# Patient Record
Sex: Female | Born: 1965 | Race: Black or African American | Hispanic: No | Smoking: Never smoker
Health system: Southern US, Community
[De-identification: ages and names within clinical notes are randomized; demographics above are authoritative.]

## PROBLEM LIST (undated history)

## (undated) DIAGNOSIS — D219 Benign neoplasm of connective and other soft tissue, unspecified: Secondary | ICD-10-CM

## (undated) DIAGNOSIS — N920 Excessive and frequent menstruation with regular cycle: Principal | ICD-10-CM

## (undated) DIAGNOSIS — N946 Dysmenorrhea, unspecified: Secondary | ICD-10-CM

## (undated) HISTORY — PX: TUBAL LIGATION: SHX77

## (undated) HISTORY — DX: Excessive and frequent menstruation with regular cycle: N92.0

## (undated) HISTORY — PX: BREAST BIOPSY: SHX20

## (undated) HISTORY — DX: Dysmenorrhea, unspecified: N94.6

## (undated) HISTORY — DX: Benign neoplasm of connective and other soft tissue, unspecified: D21.9

---

## 2007-06-10 ENCOUNTER — Other Ambulatory Visit: Admission: RE | Admit: 2007-06-10 | Discharge: 2007-06-10 | Payer: Self-pay | Admitting: Obstetrics and Gynecology

## 2008-06-11 ENCOUNTER — Other Ambulatory Visit: Admission: RE | Admit: 2008-06-11 | Discharge: 2008-06-11 | Payer: Self-pay | Admitting: Obstetrics and Gynecology

## 2009-07-08 ENCOUNTER — Other Ambulatory Visit: Admission: RE | Admit: 2009-07-08 | Discharge: 2009-07-08 | Payer: Self-pay | Admitting: Obstetrics and Gynecology

## 2010-07-16 ENCOUNTER — Other Ambulatory Visit
Admission: RE | Admit: 2010-07-16 | Discharge: 2010-07-16 | Payer: Self-pay | Source: Home / Self Care | Admitting: Obstetrics and Gynecology

## 2011-07-21 ENCOUNTER — Other Ambulatory Visit (HOSPITAL_COMMUNITY)
Admission: RE | Admit: 2011-07-21 | Discharge: 2011-07-21 | Disposition: A | Discharge status: Home / Self Care | Payer: BC Managed Care – PPO | Source: Ambulatory Visit | Attending: Obstetrics and Gynecology | Admitting: Obstetrics and Gynecology

## 2011-07-21 DIAGNOSIS — Z01419 Encounter for gynecological examination (general) (routine) without abnormal findings: Secondary | ICD-10-CM | POA: Insufficient documentation

## 2011-08-06 ENCOUNTER — Other Ambulatory Visit (HOSPITAL_COMMUNITY)
Admission: RE | Admit: 2011-08-06 | Discharge: 2011-08-06 | Disposition: A | Discharge status: Home / Self Care | Payer: BC Managed Care – PPO | Source: Ambulatory Visit | Attending: Obstetrics and Gynecology | Admitting: Obstetrics and Gynecology

## 2011-08-06 DIAGNOSIS — Z01419 Encounter for gynecological examination (general) (routine) without abnormal findings: Secondary | ICD-10-CM | POA: Insufficient documentation

## 2012-08-11 ENCOUNTER — Other Ambulatory Visit (HOSPITAL_COMMUNITY)
Admission: RE | Admit: 2012-08-11 | Discharge: 2012-08-11 | Disposition: A | Discharge status: Home / Self Care | Payer: BC Managed Care – PPO | Source: Ambulatory Visit | Attending: Obstetrics and Gynecology | Admitting: Obstetrics and Gynecology

## 2012-08-11 ENCOUNTER — Other Ambulatory Visit: Payer: Self-pay | Admitting: Adult Health

## 2012-08-11 DIAGNOSIS — Z1151 Encounter for screening for human papillomavirus (HPV): Secondary | ICD-10-CM | POA: Insufficient documentation

## 2012-08-11 DIAGNOSIS — Z01419 Encounter for gynecological examination (general) (routine) without abnormal findings: Secondary | ICD-10-CM | POA: Insufficient documentation

## 2013-06-25 ENCOUNTER — Emergency Department (HOSPITAL_BASED_OUTPATIENT_CLINIC_OR_DEPARTMENT_OTHER)
Admission: EM | Admit: 2013-06-25 | Discharge: 2013-06-25 | Disposition: A | Discharge status: Home / Self Care | Payer: BC Managed Care – PPO | Attending: Emergency Medicine | Admitting: Emergency Medicine

## 2013-06-25 ENCOUNTER — Emergency Department (HOSPITAL_BASED_OUTPATIENT_CLINIC_OR_DEPARTMENT_OTHER): Payer: BC Managed Care – PPO

## 2013-06-25 ENCOUNTER — Encounter (HOSPITAL_BASED_OUTPATIENT_CLINIC_OR_DEPARTMENT_OTHER): Payer: Self-pay | Admitting: Emergency Medicine

## 2013-06-25 DIAGNOSIS — J9801 Acute bronchospasm: Secondary | ICD-10-CM | POA: Insufficient documentation

## 2013-06-25 MED ORDER — IPRATROPIUM BROMIDE 0.02 % IN SOLN
0.5000 mg | Freq: Once | RESPIRATORY_TRACT | Status: AC
  Administered 2013-06-25: 0.5 mg via RESPIRATORY_TRACT

## 2013-06-25 MED ORDER — ALBUTEROL SULFATE HFA 108 (90 BASE) MCG/ACT IN AERS
2.0000 | INHALATION_SPRAY | RESPIRATORY_TRACT | Status: DC | PRN
  Administered 2013-06-25: 2 via RESPIRATORY_TRACT
  Filled 2013-06-25: qty 6.7

## 2013-06-25 MED ORDER — ALBUTEROL SULFATE (5 MG/ML) 0.5% IN NEBU
5.0000 mg | INHALATION_SOLUTION | Freq: Once | RESPIRATORY_TRACT | Status: AC
  Administered 2013-06-25: 5 mg via RESPIRATORY_TRACT

## 2013-06-25 MED ORDER — METHYLPREDNISOLONE SODIUM SUCC 125 MG IJ SOLR
125.0000 mg | Freq: Once | INTRAMUSCULAR | Status: AC
  Administered 2013-06-25: 125 mg via INTRAMUSCULAR
  Filled 2013-06-25: qty 2

## 2013-06-25 MED ORDER — METHYLPREDNISOLONE 4 MG PO KIT: PACK | ORAL | Status: DC

## 2013-06-25 MED ORDER — ALBUTEROL SULFATE (5 MG/ML) 0.5% IN NEBU
5.0000 mg | INHALATION_SOLUTION | Freq: Once | RESPIRATORY_TRACT | Status: AC
  Administered 2013-06-25: 5 mg via RESPIRATORY_TRACT
  Filled 2013-06-25: qty 1

## 2013-06-25 NOTE — ED Notes (Signed)
Patient transported to X-ray ambulatory with tech. 

## 2013-06-25 NOTE — ED Provider Notes (Signed)
CSN: [id number]     Arrival date & time 06/25/13  0214 History   First MD Initiated Contact with Patient 06/25/13 0225     Chief Complaint  Patient presents with  . Shortness of Breath   (Consider location/radiation/quality/duration/timing/severity/associated sxs/prior Treatment) HPI This is a 47 year old female with no significant past medical history. Three weeks ago she developed a cough and she was treated with an antibiotic and 6 days of prednisone. Her symptoms resolved until 2 days ago her cough returned. The cough is nonproductive. Yesterday she developed shortness of breath and wheezing. She tried her son's albuterol nebulizer without relief. She is also been taking Mucinex without relief. She denies fever, chills, nausea, vomiting or diarrhea. She denies chest pain. She has had some nasal congestion. Her symptoms are moderate and worse with exertion.  History reviewed. No pertinent past medical history. History reviewed. No pertinent past surgical history. History reviewed. No pertinent family history. History  Substance Use Topics  . Smoking status: Never Smoker   . Smokeless tobacco: Not on file  . Alcohol Use: Yes   OB History   Grav Para Term Preterm Abortions TAB SAB Ect Mult Living                 Review of Systems  All other systems reviewed and are negative.    Allergies  Review of patient's allergies indicates not on file.  Home Medications  No current outpatient prescriptions on file. BP 118/70  Pulse 100  Temp(Src) 98.6 F (37 C) (Oral)  Resp 20  Ht 5' 1.5" (1.562 m)  Wt 157 lb (71.215 kg)  BMI 29.19 kg/m2  SpO2 96%  Physical Exam General: Well-developed, well-nourished female in no acute distress; appearance consistent with age of record HENT: normocephalic; atraumatic Eyes: pupils equal, round and reactive to light; extraocular muscles intact; arcus senilis bilaterally Neck: supple Heart: regular rate and rhythm Lungs: Decreased air movement  bilaterally with expiratory wheezes, notably in the bases Abdomen: soft; nondistended; nontender; no masses or hepatosplenomegaly; bowel sounds present Extremities: No deformity; full range of motion Neurologic: Awake, alert and oriented; motor function intact in all extremities and symmetric; no facial droop Skin: Warm and dry Psychiatric: Normal mood and affect    ED Course  Procedures (including critical care time)   MDM  Nursing notes and vitals signs, including pulse oximetry, reviewed.  Summary of this visit's results, reviewed by myself:  Labs:  No results found for this or any previous visit (from the past 24 hour(s)).  Imaging Studies: Dg Chest 2 View  06/25/2013   CLINICAL DATA:  Shortness of breath.  EXAM: CHEST  2 VIEW  COMPARISON:  None available for comparison at time of study interpretation.  FINDINGS: Cardiomediastinal silhouette is unremarkable. The lungs are clear without pleural effusions or focal consolidations. Pulmonary vasculature is unremarkable. Trachea projects midline and there is no pneumothorax. Soft tissue planes and included osseous structures are nonsuspicious.  IMPRESSION: No acute cardiopulmonary process ; normal chest radiograph.   Electronically Signed   By: Awilda Metro   On: 06/25/2013 02:42   3:41 AM Lungs clear after second neb treatment. Will start on albuterol inhaler, steroid taper; she has a PCP with whom she will follow up.   Hanley Seamen, MD 06/25/13 [phone]

## 2013-06-25 NOTE — ED Notes (Signed)
Pt reports onset of SOB since Friday denies Hx COPD or ASTHMA  OTC mucinex  With minimal relief

## 2013-08-14 ENCOUNTER — Encounter: Payer: Self-pay | Admitting: Adult Health

## 2013-08-14 ENCOUNTER — Encounter (INDEPENDENT_AMBULATORY_CARE_PROVIDER_SITE_OTHER): Payer: Self-pay

## 2013-08-14 ENCOUNTER — Ambulatory Visit (INDEPENDENT_AMBULATORY_CARE_PROVIDER_SITE_OTHER): Payer: BC Managed Care – PPO | Admitting: Adult Health

## 2013-08-14 VITALS — BP 110/70 | HR 74 | Ht 60.5 in | Wt 159.0 lb

## 2013-08-14 DIAGNOSIS — Z1212 Encounter for screening for malignant neoplasm of rectum: Secondary | ICD-10-CM

## 2013-08-14 DIAGNOSIS — Z01419 Encounter for gynecological examination (general) (routine) without abnormal findings: Secondary | ICD-10-CM

## 2013-08-14 LAB — HEMOCCULT GUIAC POC 1CARD (OFFICE): FECAL OCCULT BLD: NEGATIVE

## 2013-08-14 NOTE — Patient Instructions (Signed)
Physical in 1 year Mammogram yearly  Colonoscopy at 49 Labs at work 

## 2013-08-14 NOTE — Progress Notes (Signed)
Patient ID: Amanda Poole, female   DOB: 1965-12-13, 48 y.o.   MRN: [id number] History of Present Illness: Kayley is a 48 year old black female in for physical , she had a normal pap with negative HPV 08/11/12. Got flu shot at work and had labs at work, that were normal.  Current Medications, Allergies, Past Medical History, Past Surgical History, Family History and Social History were reviewed in [employer] record.     Review of Systems: Patient denies any headaches, blurred vision, shortness of breath, chest pain, abdominal pain, problems with bowel movements, urination, or intercourse. No mood changes, does have pain in knees at times and left one swells occasionally but wears heels daily.    Physical Exam:BP 110/70  Pulse 74  Ht 5' 0.5" (1.537 m)  Wt 159 lb (72.122 kg)  BMI 30.53 kg/m2  LMP 07/16/2013 General:  Well developed, well nourished, no acute distress Skin:  Warm and dry Neck:  Midline trachea, normal thyroid Lungs; Clear to auscultation bilaterally Breast:  No dominant palpable mass, retraction, or nipple discharge Cardiovascular: Regular rate and rhythm Abdomen:  Soft, non tender, no hepatosplenomegaly Pelvic:  External genitalia is normal in appearance.  The vagina is normal in appearance.  The cervix is bulbous.  Uterus is felt to be normal size, shape, and contour.  No  adnexal masses or tenderness noted. Rectal: Good sphincter tone, no polyps, or hemorrhoids felt.  Hemoccult negative. Extremities:  No swelling or varicosities noted Psych:  No mood changes, alert and cooperative,seems happy Discussed menopause usually occurs at about 51.5 years with period cessation, some have hot flashes and others do not.  Impression: Yearly gyn exam no pap    Plan: Physical in 1 year, pap 2017 Mammogram yearly Colonoscopy at 59  Labs at work

## 2013-09-15 ENCOUNTER — Encounter: Payer: Self-pay | Admitting: Adult Health

## 2014-03-21 IMAGING — CR DG CHEST 2V
2 series · 2 of 2 positions shown · non-contrast
Comparison: None available for comparison at time of study
interpretation.

CLINICAL DATA: Shortness of breath.

EXAM:
CHEST  2 VIEW

[w chest pa]
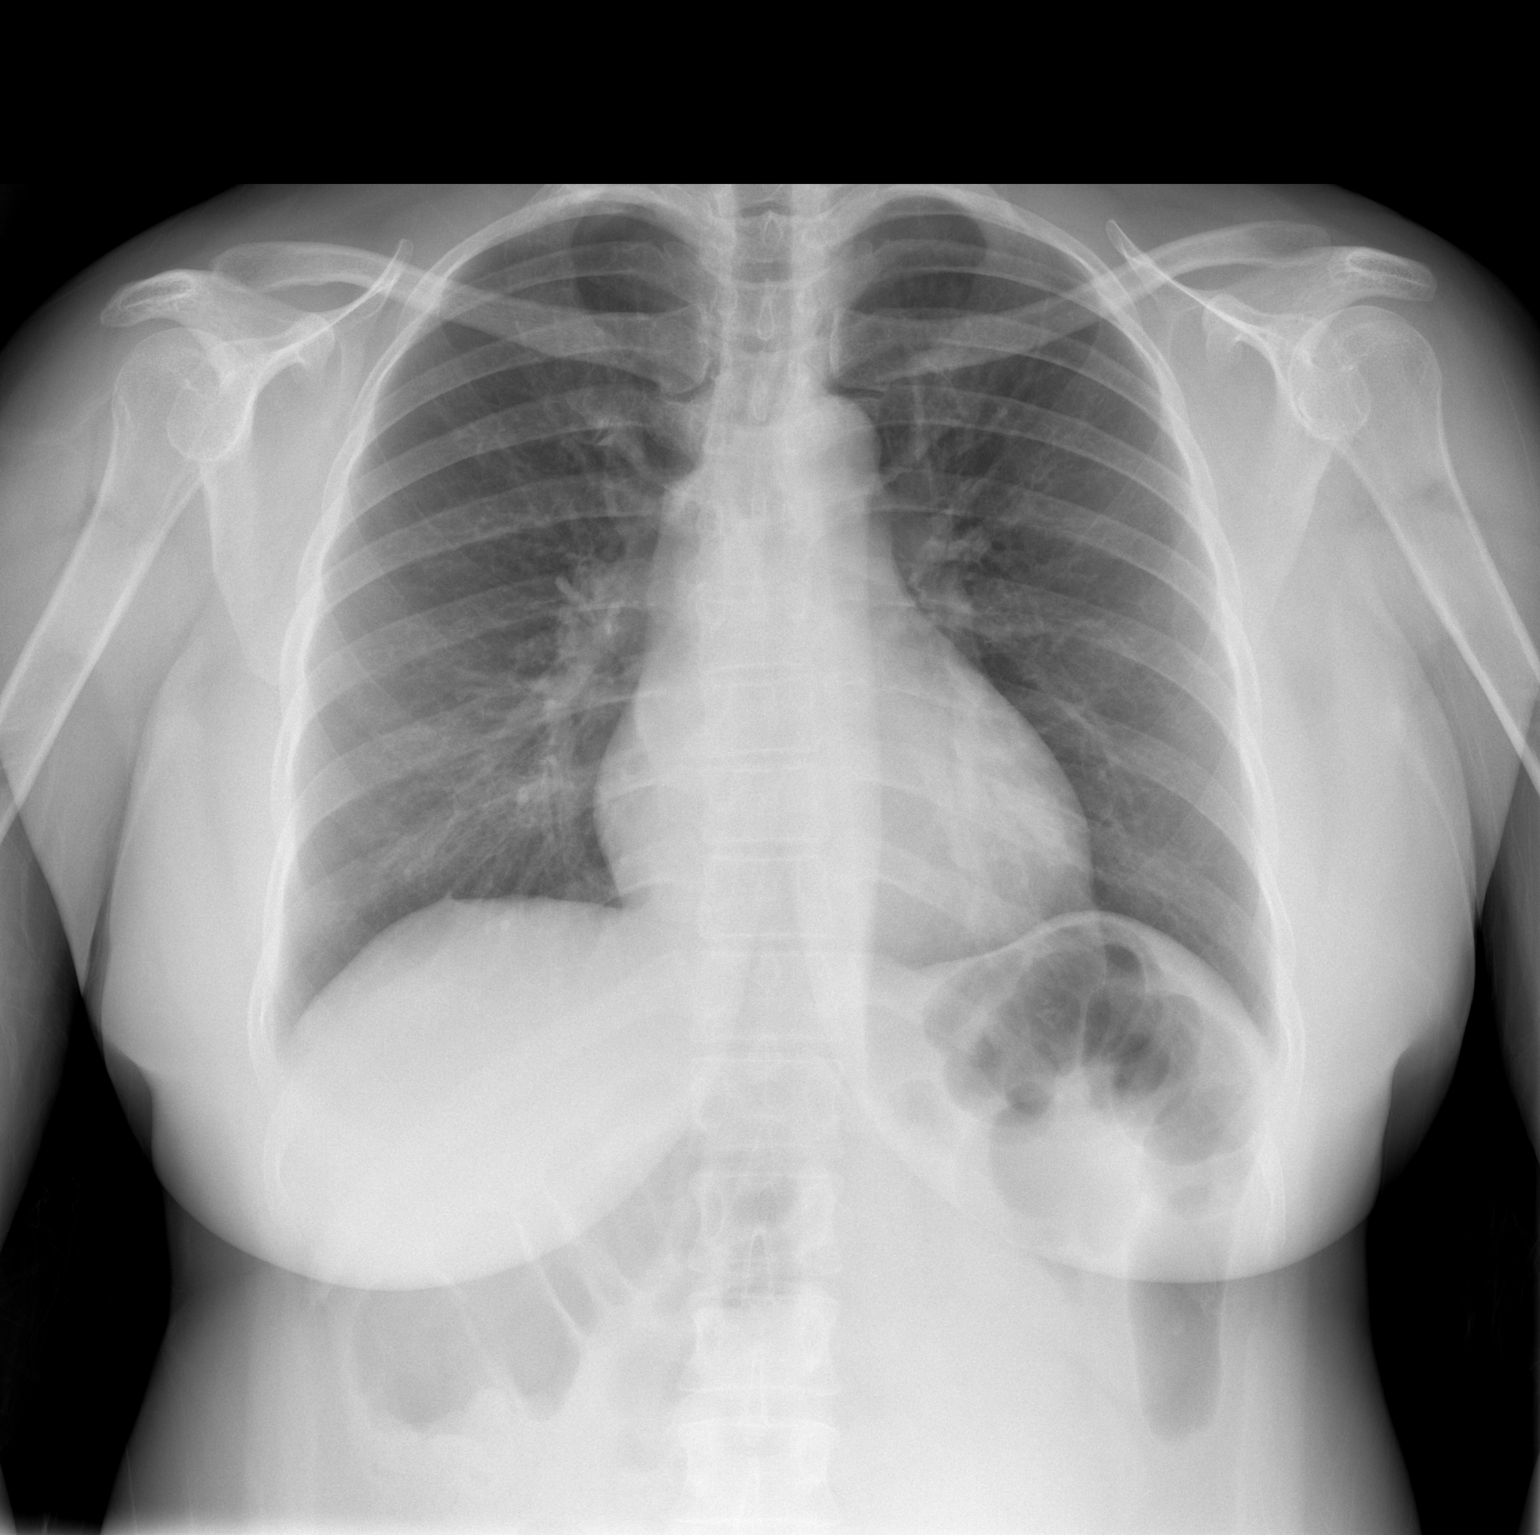

[w chest lat]
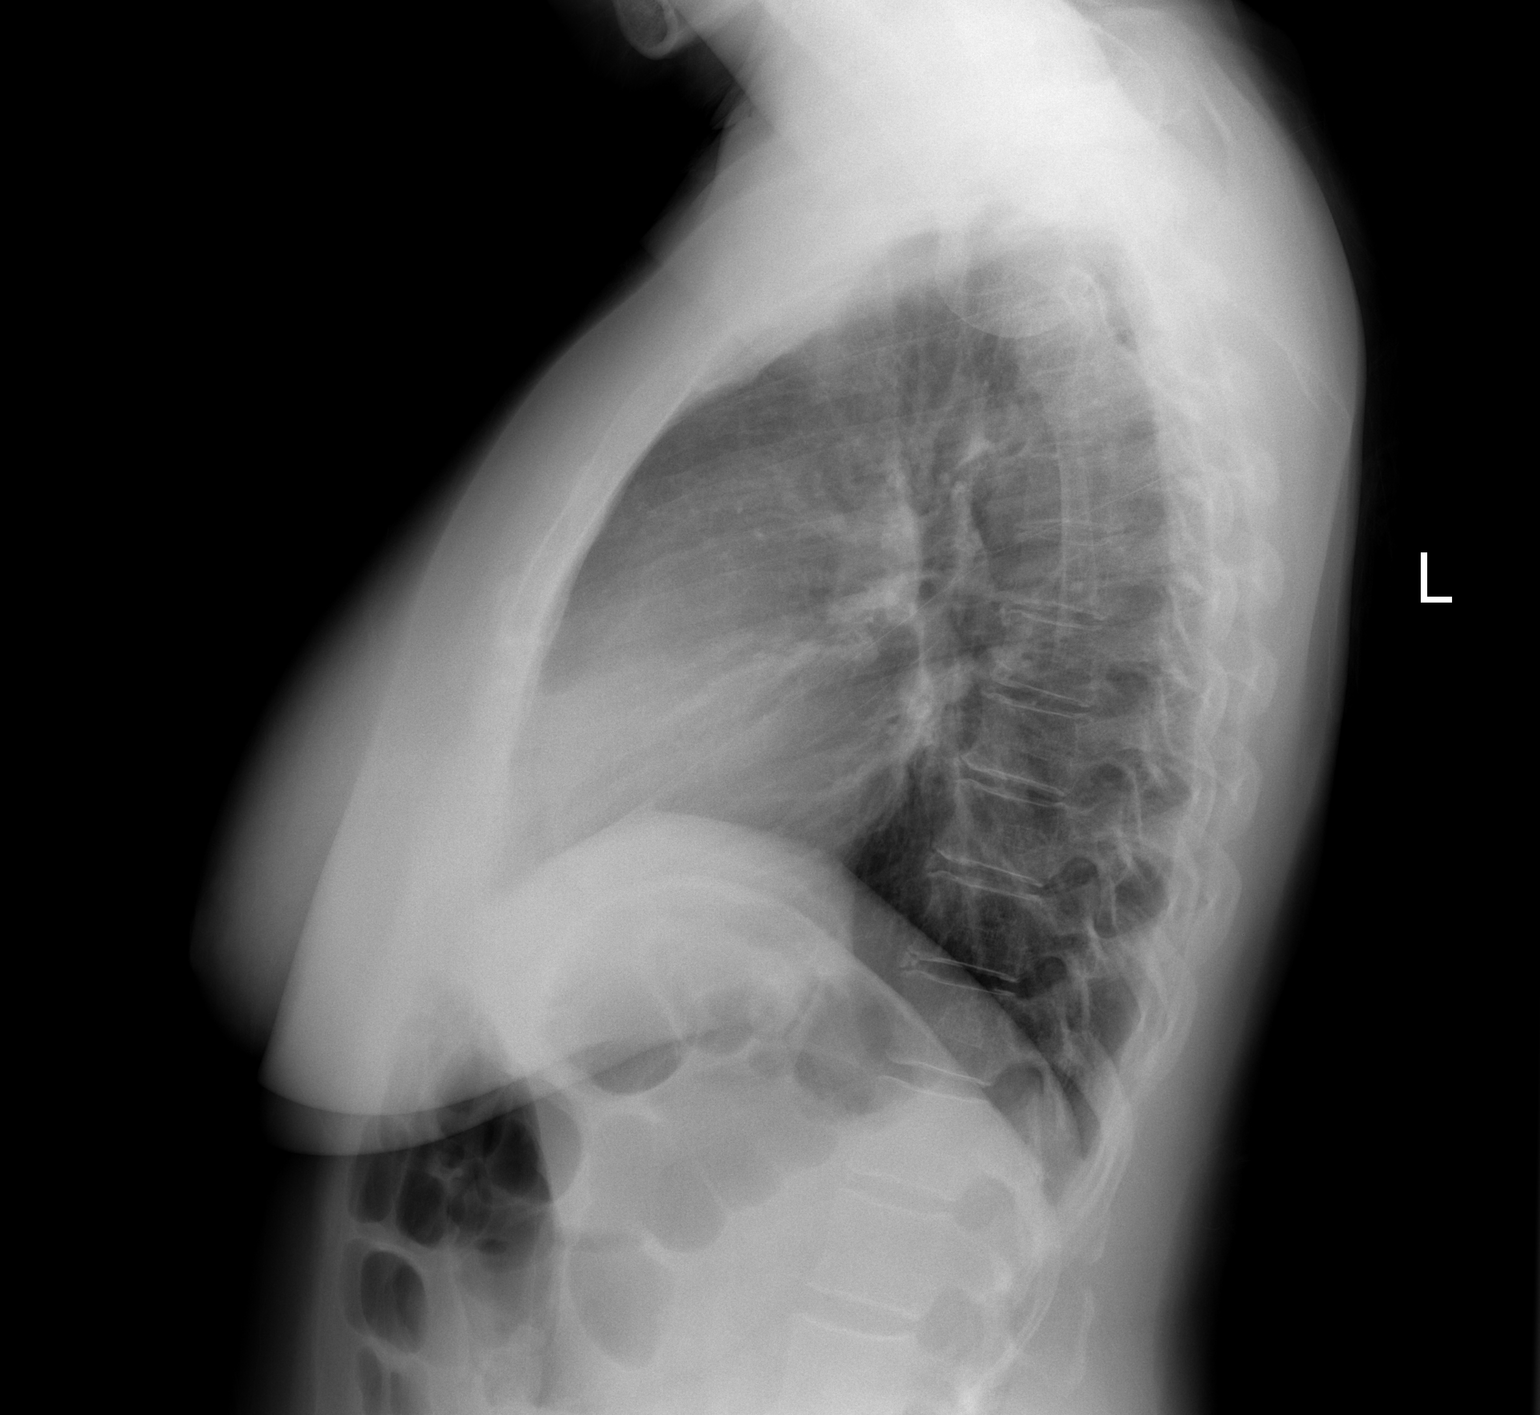

[2 of 2 positions shown; findings below may reference images not displayed]

FINDINGS: Cardiomediastinal silhouette is unremarkable. The lungs are clear
without pleural effusions or focal consolidations. Pulmonary
vasculature is unremarkable. Trachea projects midline and there is
no pneumothorax. Soft tissue planes and included osseous structures
are nonsuspicious.
IMPRESSION: No acute cardiopulmonary process ; normal chest radiograph.

  By: Jova Roopnarine

## 2014-06-11 ENCOUNTER — Encounter: Payer: Self-pay | Admitting: Adult Health

## 2014-07-11 ENCOUNTER — Telehealth: Payer: Self-pay | Admitting: Adult Health

## 2014-07-11 NOTE — Telephone Encounter (Signed)
Complains of bleeding heavy with clots x 1 day,having cramps just had period 2 weeks ago.Make appt for [place] and see me tomorrow

## 2014-07-12 ENCOUNTER — Ambulatory Visit (INDEPENDENT_AMBULATORY_CARE_PROVIDER_SITE_OTHER): Payer: 59

## 2014-07-12 ENCOUNTER — Encounter: Payer: Self-pay | Admitting: Adult Health

## 2014-07-12 ENCOUNTER — Ambulatory Visit (INDEPENDENT_AMBULATORY_CARE_PROVIDER_SITE_OTHER): Payer: 59 | Admitting: Adult Health

## 2014-07-12 ENCOUNTER — Other Ambulatory Visit: Payer: Self-pay | Admitting: Adult Health

## 2014-07-12 VITALS — BP 108/70 | Ht 61.0 in | Wt 158.0 lb

## 2014-07-12 DIAGNOSIS — D259 Leiomyoma of uterus, unspecified: Secondary | ICD-10-CM

## 2014-07-12 DIAGNOSIS — N920 Excessive and frequent menstruation with regular cycle: Secondary | ICD-10-CM

## 2014-07-12 DIAGNOSIS — N938 Other specified abnormal uterine and vaginal bleeding: Secondary | ICD-10-CM

## 2014-07-12 DIAGNOSIS — D219 Benign neoplasm of connective and other soft tissue, unspecified: Secondary | ICD-10-CM

## 2014-07-12 DIAGNOSIS — N946 Dysmenorrhea, unspecified: Secondary | ICD-10-CM

## 2014-07-12 HISTORY — DX: Dysmenorrhea, unspecified: N94.6

## 2014-07-12 HISTORY — DX: Benign neoplasm of connective and other soft tissue, unspecified: D21.9

## 2014-07-12 HISTORY — DX: Excessive and frequent menstruation with regular cycle: N92.0

## 2014-07-12 MED ORDER — MEGESTROL ACETATE 40 MG PO TABS: ORAL_TABLET | ORAL | Status: DC

## 2014-07-12 NOTE — Patient Instructions (Signed)
Dysmenorrhea Menstrual cramps (dysmenorrhea) are caused by the muscles of the uterus tightening (contracting) during a menstrual period. For some women, this discomfort is merely bothersome. For others, dysmenorrhea can be severe enough to interfere with everyday activities for a few days each month. Primary dysmenorrhea is menstrual cramps that last a couple of days when you start having menstrual periods or soon after. This often begins after a teenager starts having her period. As a woman gets older or has a baby, the cramps will usually lessen or disappear. Secondary dysmenorrhea begins later in life, lasts longer, and the pain may be stronger than primary dysmenorrhea. The pain may start before the period and last a few days after the period.  CAUSES  Dysmenorrhea is usually caused by an underlying problem, such as:  The tissue lining the uterus grows outside of the uterus in other areas of the body (endometriosis).  The endometrial tissue, which normally lines the uterus, is found in or grows into the muscular walls of the uterus (adenomyosis).  The pelvic blood vessels are engorged with blood just before the menstrual period (pelvic congestive syndrome).  Overgrowth of cells (polyps) in the lining of the uterus or cervix.  Falling down of the uterus (prolapse) because of loose or stretched ligaments.  Depression.  Bladder problems, infection, or inflammation.  Problems with the intestine, a tumor, or irritable bowel syndrome.  Cancer of the female organs or bladder.  A severely tipped uterus.  A very tight opening or closed cervix.  Noncancerous tumors of the uterus (fibroids).  Pelvic inflammatory disease (PID).  Pelvic scarring (adhesions) from a previous surgery.  Ovarian cyst.  An intrauterine device (IUD) used for birth control. RISK FACTORS You may be at greater risk of dysmenorrhea if:  You are younger than age 62.  You started puberty early.  You have  irregular or heavy bleeding.  You have never given birth.  You have a family history of this problem.  You are a smoker. SIGNS AND SYMPTOMS   Cramping or throbbing pain in your lower abdomen.  Headaches.  Lower back pain.  Nausea or vomiting.  Diarrhea.  Sweating or dizziness.  Loose stools. DIAGNOSIS  A diagnosis is based on your history, symptoms, physical exam, diagnostic tests, or procedures. Diagnostic tests or procedures may include:  Blood tests.  Ultrasonography.  An examination of the lining of the uterus (dilation and curettage, D&C).  An examination inside your abdomen or pelvis with a scope (laparoscopy).  X-rays.  CT scan.  MRI.  An examination inside the bladder with a scope (cystoscopy).  An examination inside the intestine or stomach with a scope (colonoscopy, gastroscopy). TREATMENT  Treatment depends on the cause of the dysmenorrhea. Treatment may include:  Pain medicine prescribed by your health care provider.  Birth control pills or an IUD with progesterone hormone in it.  Hormone replacement therapy.  Nonsteroidal anti-inflammatory drugs (NSAIDs). These may help stop the production of prostaglandins.  Surgery to remove adhesions, endometriosis, ovarian cyst, or fibroids.  Removal of the uterus (hysterectomy).  Progesterone shots to stop the menstrual period.  Cutting the nerves on the sacrum that go to the female organs (presacral neurectomy).  Electric current to the sacral nerves (sacral nerve stimulation).  Antidepressant medicine.  Psychiatric therapy, counseling, or group therapy.  Exercise and physical therapy.  Meditation and yoga therapy.  Acupuncture. HOME CARE INSTRUCTIONS   Only take over-the-counter or prescription medicines as directed by your health care provider.  Place a heating pad  or hot water bottle on your lower back or abdomen. Do not sleep with the heating pad.  Use aerobic exercises, walking,  swimming, biking, and other exercises to help lessen the cramping.  Massage to the lower back or abdomen may help.  Stop smoking.  Avoid alcohol and caffeine. SEEK MEDICAL CARE IF:   Your pain does not get better with medicine.  You have pain with sexual intercourse.  Your pain increases and is not controlled with medicines.  You have abnormal vaginal bleeding with your period.  You develop nausea or vomiting with your period that is not controlled with medicine. SEEK IMMEDIATE MEDICAL CARE IF:  You pass out.  Document Released: 07/27/2005 Document Revised: 03/29/2013 Document Reviewed: 01/12/2013 [employer] Patient Information 2015 [employer]. This information is not intended to replace advice given to you by your health care provider. Make sure you discuss any questions you have with your health care provider. Menorrhagia Menorrhagia is the medical term for when your menstrual periods are heavy or last longer than usual. With menorrhagia, every period you have may cause enough blood loss and cramping that you are unable to maintain your usual activities. CAUSES  In some cases, the cause of heavy periods is unknown, but a number of conditions may cause menorrhagia. Common causes include:  A problem with the hormone-producing thyroid gland (hypothyroid).  Noncancerous growths in the uterus (polyps or fibroids).  An imbalance of the estrogen and progesterone hormones.  One of your ovaries not releasing an egg during one or more months.  Side effects of having an intrauterine device (IUD).  Side effects of some medicines, such as anti-inflammatory medicines or blood thinners.  A bleeding disorder that stops your blood from clotting normally. SIGNS AND SYMPTOMS  During a normal period, bleeding lasts between 4 and 8 days. Signs that your periods are too heavy include:  You routinely have to change your pad or tampon every 1 or 2 hours because it is completely soaked.  You  pass blood clots larger than 1 inch (2.5 cm) in size.  You have bleeding for more than 7 days.  You need to use pads and tampons at the same time because of heavy bleeding.  You need to wake up to change your pads or tampons during the night.  You have symptoms of anemia, such as tiredness, fatigue, or shortness of breath. DIAGNOSIS  Your health care provider will perform a physical exam and ask you questions about your symptoms and menstrual history. Other tests may be ordered based on what the health care provider finds during the exam. These tests can include:  Blood tests. Blood tests are used to check if you are pregnant or have hormonal changes, a bleeding or thyroid disorder, low iron levels (anemia), or other problems.  Endometrial biopsy. Your health care provider takes a sample of tissue from the inside of your uterus to be examined under a microscope.  Pelvic ultrasound. This test uses sound waves to make a picture of your uterus, ovaries, and vagina. The pictures can show if you have fibroids or other growths.  Hysteroscopy. For this test, your health care provider will use a small telescope to look inside your uterus. Based on the results of your initial tests, your health care provider may recommend further testing. TREATMENT  Treatment may not be needed. If it is needed, your health care provider may recommend treatment with one or more medicines first. If these do not reduce bleeding enough, a surgical treatment   might be an option. The best treatment for you will depend on:   Whether you need to prevent pregnancy.  Your desire to have children in the future.  The cause and severity of your bleeding.  Your opinion and personal preference.  Medicines for menorrhagia may include:  Birth control methods that use hormones. These include the pill, skin patch, vaginal ring, shots that you get every 3 months, hormonal IUD, and implant. These treatments reduce bleeding  during your menstrual period.  Medicines that thicken blood and slow bleeding.  Medicines that reduce swelling, such as ibuprofen.  Medicines that contain a synthetic hormone called progestin.   Medicines that make the ovaries stop working for a short time.  You may need surgical treatment for menorrhagia if the medicines are unsuccessful. Treatment options include:  Dilation and curettage (D&C). In this procedure, your health care provider opens (dilates) your cervix and then scrapes or suctions tissue from the lining of your uterus to reduce menstrual bleeding.  Operative hysteroscopy. This procedure uses a tiny tube with a light (hysteroscope) to view your uterine cavity and can help in the surgical removal of a polyp that may be causing heavy periods.  Endometrial ablation. Through various techniques, your health care provider permanently destroys the entire lining of your uterus (endometrium). After endometrial ablation, most women have little or no menstrual flow. Endometrial ablation reduces your ability to become pregnant.  Endometrial resection. This surgical procedure uses an electrosurgical wire loop to remove the lining of the uterus. This procedure also reduces your ability to become pregnant.  Hysterectomy. Surgical removal of the uterus and cervix is a permanent procedure that stops menstrual periods. Pregnancy is not possible after a hysterectomy. This procedure requires anesthesia and hospitalization. HOME CARE INSTRUCTIONS   Only take over-the-counter or prescription medicines as directed by your health care provider. Take prescribed medicines exactly as directed. Do not change or switch medicines without consulting your health care provider.  Take any prescribed iron pills exactly as directed by your health care provider. Long-term heavy bleeding may result in low iron levels. Iron pills help replace the iron your body lost from heavy bleeding. Iron may cause  constipation. If this becomes a problem, increase the bran, fruits, and roughage in your diet.  Do not take aspirin or medicines that contain aspirin 1 week before or during your menstrual period. Aspirin may make the bleeding worse.  If you need to change your sanitary pad or tampon more than once every 2 hours, stay in bed and rest as much as possible until the bleeding stops.  Eat well-balanced meals. Eat foods high in iron. Examples are leafy green vegetables, meat, liver, eggs, and whole grain breads and cereals. Do not try to lose weight until the abnormal bleeding has stopped and your blood iron level is back to normal. SEEK MEDICAL CARE IF:   You soak through a pad or tampon every 1 or 2 hours, and this happens every time you have a period.  You need to use pads and tampons at the same time because you are bleeding so much.  You need to change your pad or tampon during the night.  You have a period that lasts for more than 8 days.  You pass clots bigger than 1 inch wide.  You have irregular periods that happen more or less often than once a month.  You feel dizzy or faint.  You feel very weak or tired.  You feel short of breath or   feel your heart is beating too fast when you exercise.  You have nausea and vomiting or diarrhea while you are taking your medicine.  You have any problems that may be related to the medicine you are taking. SEEK IMMEDIATE MEDICAL CARE IF:   You soak through 4 or more pads or tampons in 2 hours.  You have any bleeding while you are pregnant. MAKE SURE YOU:   Understand these instructions.  Will watch your condition.  Will get help right away if you are not doing well or get worse. Document Released: 07/27/2005 Document Revised: 08/01/2013 Document Reviewed: 01/15/2013 [facility] Patient Information 2015 [place], [place]. This information is not intended to replace advice given to you by your health care provider. Make sure you discuss any  questions you have with your health care provider. Uterine Fibroid A uterine fibroid is a growth (tumor) that occurs in your uterus. This type of tumor is not cancerous and does not spread out of the uterus. You can have one or many fibroids. Fibroids can vary in size, weight, and where they grow in the uterus. Some can become quite large. Most fibroids do not require medical treatment, but some can cause pain or heavy bleeding during and between periods. CAUSES  A fibroid is the result of a single uterine cell that keeps growing (unregulated), which is different than most cells in the human body. Most cells have a control mechanism that keeps them from reproducing without control.  SIGNS AND SYMPTOMS   Bleeding.  Pelvic pain and pressure.  Bladder problems due to the size of the fibroid.  Infertility and miscarriages depending on the size and location of the fibroid. DIAGNOSIS  Uterine fibroids are diagnosed through a physical exam. Your health care provider may feel the lumpy tumors during a pelvic exam. Ultrasonography may be done to get information regarding size, location, and number of tumors.  TREATMENT   Your health care provider may recommend watchful waiting. This involves getting the fibroid checked by your health care provider to see if it grows or shrinks.   Hormone treatment or an intrauterine device (IUD) may be prescribed.   Surgery may be needed to remove the fibroids (myomectomy) or the uterus (hysterectomy). This depends on your situation. When fibroids interfere with fertility and a woman wants to become pregnant, a health care provider may recommend having the fibroids removed.  [place] care depends on how you were treated. In general:   Keep all follow-up appointments with your health care provider.   Only take over-the-counter or prescription medicines as directed by your health care provider. If you were prescribed a hormone treatment, take  the hormone medicines exactly as directed. Do not take aspirin. It can cause bleeding.   Talk to your health care provider about taking iron pills.  If your periods are troublesome but not so heavy, lie down with your feet raised slightly above your heart. Place cold packs on your lower abdomen.   If your periods are heavy, write down the number of pads or tampons you use per month. Bring this information to your health care provider.   Include green vegetables in your diet.  SEEK IMMEDIATE MEDICAL CARE IF:  You have pelvic pain or cramps not controlled with medicines.   You have a sudden increase in pelvic pain.   You have an increase in bleeding between and during periods.   You have excessive periods and soak tampons or pads in a half hour  or less.  You feel lightheaded or have fainting episodes. Document Released: 07/24/2000 Document Revised: 05/17/2013 Document Reviewed: 02/23/2013 [facility] Patient Information 2015 [place], [place]. This information is not intended to replace advice given to you by your health care provider. Make sure you discuss any questions you have with your health care provider. Take megace  Return in 6 weeks to see Dr Elonda Husky

## 2014-07-12 NOTE — Progress Notes (Signed)
Subjective:     Patient ID: Amanda Poole, female   DOB: May 15, 1966, 48 y.o.   MRN: [id number]  HPI Ivana is a 48 year old black female in for [place] for heavy bleeding with clots for 2 days, had regular period 2 weeks ago.Having some cramps.She is sp BTL  Review of Systems See HPI Reviewed past medical,surgical, social and family history. Reviewed medications and allergies.     Objective:   Physical Exam BP 108/70 mmHg  Ht 5\' 1"  (1.549 m)  Wt 158 lb (71.668 kg)  BMI 29.87 kg/m2  LMP 07/10/2014 (Exact Date)Reviewed [place] with pt. Uterus: Anteverted 10.7 x 8.8 x 5.3 cm, with multiple fibroids noted throughout myometrium largest= 60mm  Endometrium 8.3 mm, distorted by fibroids although no obvious mass noted within the cavity difficult to evaluate due to fibroids  Right ovary 2.6 x 1.5 x 1.4 cm,   Left ovary 2.1 x 1.2 x 0.9 cm,   No free fluid or adnexal masses noted within the pelvis  Technician Comments:  Anteverted uterus noted with multiple fibroids noted throughout the myometrium, Endometrium distorted by fibroids, bilateral adnexa/ovaries appears WNL Will try megace to stop bleeding, discussed with Dr Elonda Husky.     Assessment:     Menorrhagia Dysmenorrhea Uterine fibroids    Plan:    Return in 6 weeks to see Dr Elonda Husky Rx megace 40 mg #45 3 x 5 days then 2 x 5 days then 1 daily with 1 refill   Review handouts on menorrhagia, dysmenorrhea and fibroids and also on ablation Check CBC,CMP,TSH

## 2014-07-13 ENCOUNTER — Telehealth: Payer: Self-pay | Admitting: Adult Health

## 2014-07-13 LAB — CBC
HCT: 35.2 % — ABNORMAL LOW (ref 36.0–46.0)
Hemoglobin: 11.5 g/dL — ABNORMAL LOW (ref 12.0–15.0)
MCH: 26.9 pg (ref 26.0–34.0)
MCHC: 32.7 g/dL (ref 30.0–36.0)
MCV: 82.2 fL (ref 78.0–100.0)
MPV: 10.4 fL (ref 9.4–12.4)
Platelets: 286 10*3/uL (ref 150–400)
RBC: 4.28 MIL/uL (ref 3.87–5.11)
RDW: 15 % (ref 11.5–15.5)
WBC: 5.7 10*3/uL (ref 4.0–10.5)

## 2014-07-13 LAB — COMPREHENSIVE METABOLIC PANEL
ALK PHOS: 66 U/L (ref 39–117)
ALT: 10 U/L (ref 0–35)
AST: 14 U/L (ref 0–37)
Albumin: 3.6 g/dL (ref 3.5–5.2)
BUN: 11 mg/dL (ref 6–23)
CO2: 27 mEq/L (ref 19–32)
Calcium: 9.2 mg/dL (ref 8.4–10.5)
Chloride: 105 mEq/L (ref 96–112)
Creat: 0.61 mg/dL (ref 0.50–1.10)
Glucose, Bld: 81 mg/dL (ref 70–99)
POTASSIUM: 4.2 meq/L (ref 3.5–5.3)
SODIUM: 137 meq/L (ref 135–145)
TOTAL PROTEIN: 6.6 g/dL (ref 6.0–8.3)
Total Bilirubin: 0.2 mg/dL (ref 0.2–1.2)

## 2014-07-13 LAB — TSH: TSH: 0.918 u[IU]/mL (ref 0.350–4.500)

## 2014-07-13 NOTE — Telephone Encounter (Signed)
Left message about labs and take MV with fe

## 2014-08-23 ENCOUNTER — Encounter: Payer: Self-pay | Admitting: Obstetrics & Gynecology

## 2014-08-23 ENCOUNTER — Ambulatory Visit (INDEPENDENT_AMBULATORY_CARE_PROVIDER_SITE_OTHER): Payer: 59 | Admitting: Obstetrics & Gynecology

## 2014-08-23 VITALS — BP 126/80 | Ht 61.0 in | Wt 161.0 lb

## 2014-08-23 DIAGNOSIS — N920 Excessive and frequent menstruation with regular cycle: Secondary | ICD-10-CM

## 2014-08-23 DIAGNOSIS — D259 Leiomyoma of uterus, unspecified: Secondary | ICD-10-CM

## 2014-08-23 DIAGNOSIS — N946 Dysmenorrhea, unspecified: Secondary | ICD-10-CM

## 2014-08-23 NOTE — Progress Notes (Signed)
Patient ID: Amanda Poole, female   DOB: 05/23/1966, 49 y.o.   MRN: [id number]  See note below Recommend continue the megestrol for this cycle  Pt wants to see what happens when she comes off If issues return give me a call and we can discuss alternatives     Subjective:     Patient ID: Amanda Poole, female   DOB: [birth year], 49 y.o.   MRN: [id number]  HPI Amanda Poole is a 49 year old black female in for [place] for heavy bleeding with clots for 2 days, had regular period 2 weeks ago.Having some cramps.She is sp BTL  Review of Systems See HPI Reviewed past medical,surgical, social and family history. Reviewed medications and allergies.     Objective:   Physical Exam BP 126/80 mmHg  Ht 5\' 1"  (1.549 m)  Wt 161 lb (73.029 kg)  BMI 30.44 kg/m2Reviewed [place] with pt. Uterus: Anteverted 10.7 x 8.8 x 5.3 cm, with multiple fibroids noted throughout myometrium largest= 59mm  Endometrium 8.3 mm, distorted by fibroids although no obvious mass noted within the cavity difficult to evaluate due to fibroids  Right ovary 2.6 x 1.5 x 1.4 cm,   Left ovary 2.1 x 1.2 x 0.9 cm,   No free fluid or adnexal masses noted within the pelvis  Technician Comments:  Anteverted uterus noted with multiple fibroids noted throughout the myometrium, Endometrium distorted by fibroids, bilateral adnexa/ovaries appears WNL Will try megace to stop bleeding, discussed with Dr Elonda Husky.     Assessment:     Menorrhagia Dysmenorrhea Uterine fibroids    Plan:    Return in 6 weeks to see Dr Elonda Husky Rx megace 40 mg #45 3 x 5 days then 2 x 5 days then 1 daily with 1 refill   Review handouts on menorrhagia, dysmenorrhea and fibroids and also on ablation Check CBC,CMP,TSH

## 2014-09-12 ENCOUNTER — Other Ambulatory Visit: Payer: Self-pay | Admitting: Adult Health

## 2014-10-10 ENCOUNTER — Telehealth: Payer: Self-pay | Admitting: Adult Health

## 2014-10-10 NOTE — Telephone Encounter (Signed)
Keep taking megace and make appt with Dr Elonda Husky to discuss ablation vs hyst.

## 2014-10-16 ENCOUNTER — Encounter: Payer: Self-pay | Admitting: Obstetrics & Gynecology

## 2014-10-16 ENCOUNTER — Ambulatory Visit (INDEPENDENT_AMBULATORY_CARE_PROVIDER_SITE_OTHER): Payer: 59 | Admitting: Obstetrics & Gynecology

## 2014-10-16 VITALS — BP 118/80 | HR 84 | Wt 169.0 lb

## 2014-10-16 DIAGNOSIS — D500 Iron deficiency anemia secondary to blood loss (chronic): Secondary | ICD-10-CM | POA: Diagnosis not present

## 2014-10-16 DIAGNOSIS — N946 Dysmenorrhea, unspecified: Secondary | ICD-10-CM | POA: Diagnosis not present

## 2014-10-16 DIAGNOSIS — N920 Excessive and frequent menstruation with regular cycle: Secondary | ICD-10-CM

## 2014-10-16 DIAGNOSIS — D259 Leiomyoma of uterus, unspecified: Secondary | ICD-10-CM

## 2014-10-16 LAB — POCT HEMOGLOBIN: HEMOGLOBIN: 10.7 g/dL — AB (ref 12.2–16.2)

## 2014-10-16 NOTE — Addendum Note (Signed)
Addended by: Doyne Keel on: 10/16/2014 04:57 PM   Modules accepted: Orders

## 2014-10-16 NOTE — Progress Notes (Signed)
Patient ID: Amanda Poole, female   DOB: 1966/05/05, 49 y.o.   MRN: [id number] Decision for surgery visit: See H&P below  Preoperative History and Physical  Amanda Poole is a 49 y.o. [id number] with Patient's last menstrual period was 10/05/2014. admitted for a hysteroscopy uterine curettage endometrial ablation using NovaSure.  Has been having heavy menstrual bleeding clotting with anemia and was controlled ok on megace, sonogram reveals small endometrial distorting fibroids but nothing major. Now bleeding despite the meagce now for 10 days heavy crampy clots  PMH:    Past Medical History  Diagnosis Date  . Menorrhagia with regular cycle 07/12/2014  . Dysmenorrhea 07/12/2014  . Fibroids 07/12/2014    PSH:     Past Surgical History  Procedure Laterality Date  . Tubal ligation      POb/GynH:      OB History    Gravida Para Term Preterm AB TAB SAB Ectopic Multiple Living   3 2   1 1    2       SH:   History  Substance Use Topics  . Smoking status: Never Smoker   . Smokeless tobacco: Never Used  . Alcohol Use: Yes     Comment: occ.    FH:    Family History  Problem Relation Age of Onset  . Hypertension Mother   . Cancer Father     esophageal  . Diabetes Sister   . Cancer Paternal Aunt   . Cancer Paternal Uncle   . Heart disease Maternal Grandmother   . Heart attack Maternal Grandmother   . Asthma Son      Allergies: No Known Allergies  Medications:       Current outpatient prescriptions:  .  megestrol (MEGACE) 40 MG tablet, TAKE 3 DAILY FOR 5 DAYS THEN 2 DAILY FOR 5 DAYS THEN 1 TABLET DAILY, Disp: 45 tablet, Rfl: 1  Review of Systems:   Review of Systems  Constitutional: Negative for fever, chills, weight loss, malaise/fatigue and diaphoresis.  HENT: Negative for hearing loss, ear pain, nosebleeds, congestion, sore throat, neck pain, tinnitus and ear discharge.   Eyes: Negative for blurred vision, double vision, photophobia, pain, discharge and redness.   Respiratory: Negative for cough, hemoptysis, sputum production, shortness of breath, wheezing and stridor.   Cardiovascular: Negative for chest pain, palpitations, orthopnea, claudication, leg swelling and PND.  Gastrointestinal: negativefor abdominal pain. Negative for heartburn, nausea, vomiting, diarrhea, constipation, blood in stool and melena.  Genitourinary: Negative for dysuria, urgency, frequency, hematuria and flank pain.  Musculoskeletal: Negative for myalgias, back pain, joint pain and falls.  Skin: Negative for itching and rash.  Neurological: Negative for dizziness, tingling, tremors, sensory change, speech change, focal weakness, seizures, loss of consciousness, weakness and headaches.  Endo/Heme/Allergies: Negative for environmental allergies and polydipsia. Does not bruise/bleed easily.  Psychiatric/Behavioral: Negative for depression, suicidal ideas, hallucinations, memory loss and substance abuse. The patient is not nervous/anxious and does not have insomnia.      PHYSICAL EXAM:  Blood pressure 118/80, pulse 84, weight 169 lb (76.658 kg), last menstrual period 10/05/2014.    Vitals reviewed. Constitutional: She is oriented to person, place, and time. She appears well-developed and well-nourished.  HENT:  Head: Normocephalic and atraumatic.  Right Ear: External ear normal.  Left Ear: External ear normal.  Nose: Nose normal.  Mouth/Throat: Oropharynx is clear and moist.  Eyes: Conjunctivae and EOM are normal. Pupils are equal, round, and reactive to light. Right eye exhibits no discharge. Left eye exhibits no discharge.  No scleral icterus.  Neck: Normal range of motion. Neck supple. No tracheal deviation present. No thyromegaly present.  Cardiovascular: Normal rate, regular rhythm, normal heart sounds and intact distal pulses.  Exam reveals no gallop and no friction rub.   No murmur heard. Respiratory: Effort normal and breath sounds normal. No respiratory distress. She  has no wheezes. She has no rales. She exhibits no tenderness.  GI: Soft. Bowel sounds are normal. She exhibits no distension and no mass. There is tenderness. There is no rebound and no guarding.  Genitourinary:       Vulva is normal without lesions Vagina is pink moist without discharge Cervix normal in appearance and pap is normal Uterus is normal size see sonogram Adnexa is negative with normal sized ovaries by sonogram  Musculoskeletal: Normal range of motion. She exhibits no edema and no tenderness.  Neurological: She is alert and oriented to person, place, and time. She has normal reflexes. She displays normal reflexes. No cranial nerve deficit. She exhibits normal muscle tone. Coordination normal.  Skin: Skin is warm and dry. No rash noted. No erythema. No pallor.  Psychiatric: She has a normal mood and affect. Her behavior is normal. Judgment and thought content normal.    Labs: No results found for this or any previous visit (from the past 336 hour(s)).  EKG: No orders found for this or any previous visit.  Imaging Studies: GYNECOLOGIC SONOGRAM   Nixon Sparr is a 49 y.o. [id number] LMP 07/10/2014 for a pelvic sonogram for DUB with clots. Pt is s/p BTL.  Uterus Anteverted 10.7 x 8.8 x 5.3 cm, with multiple fibroids noted throughout myometrium largest= 53mm  Endometrium 8.3 mm, distorted by fibroids although no obvious mass noted within the cavity difficult to evaluate due to fibroids  Right ovary 2.6 x 1.5 x 1.4 cm,   Left ovary 2.1 x 1.2 x 0.9 cm,   No free fluid or adnexal masses noted within the pelvis  Technician Comments:  Anteverted uterus noted with multiple fibroids noted throughout the myometrium, Endometrium distorted by fibroids, bilateral adnexa/ovaries appears WNL   [name] 07/12/2014 3:28 PM  Clinical Impression and recommendations:  I have reviewed the sonogram results above, combined  with the patient's current clinical course, below are my impressions and any appropriate recommendations for management based on the sonographic findings.  Enlarged fibroid uterus, submucosal Normal ovaries   Steph Cheadle H 07/13/2014 5:33 PM     Assessment: Patient Active Problem List   Diagnosis Date Noted  . Menorrhagia with regular cycle 07/12/2014  . Dysmenorrhea 07/12/2014  . Fibroids 07/12/2014  anemia  Plan: Hysteroscopy uterine curettage endometrial ablation 10/31/2014  Vallie Fayette H 10/16/2014 3:50 PM

## 2014-10-26 ENCOUNTER — Telehealth: Payer: Self-pay | Admitting: Adult Health

## 2014-10-26 MED ORDER — IBUPROFEN 800 MG PO TABS: 800.0000 mg | ORAL_TABLET | Freq: Three times a day (TID) | ORAL | Status: AC | PRN

## 2014-10-26 NOTE — Patient Instructions (Signed)
Amanda Poole  10/26/2014   Your procedure is scheduled on:  10/31/2014  Report to [facility] at 8:15 AM.  Call this number if you have problems the morning of surgery: [phone]   Remember:   Do not eat food or drink liquids after midnight.   Take these medicines the morning of surgery with A SIP OF WATER: Megace   Do not wear jewelry, make-up or nail polish.  Do not wear lotions, powders, or perfumes. You may wear deodorant.  Do not shave 48 hours prior to surgery. Men may shave face and neck.  Do not bring valuables to the hospital.  [facility] is not responsible for any belongings or valuables.               Contacts, dentures or bridgework may not be worn into surgery.  Leave suitcase in the car. After surgery it may be brought to your room.  For patients admitted to the hospital, discharge time is determined by your treatment team.               Patients discharged the day of surgery will not be allowed to drive home.  Name and phone number of your driver:   Special Instructions: N/A   Please read over the following fact sheets that you were given: Anesthesia Post-op Instructions   PATIENT INSTRUCTIONS POST-ANESTHESIA  IMMEDIATELY FOLLOWING SURGERY:  Do not drive or operate machinery for the first twenty four hours after surgery.  Do not make any important decisions for twenty four hours after surgery or while taking narcotic pain medications or sedatives.  If you develop intractable nausea and vomiting or a severe headache please notify your doctor immediately.  FOLLOW-UP:  Please make an appointment with your surgeon as instructed. You do not need to follow up with anesthesia unless specifically instructed to do so.  WOUND CARE INSTRUCTIONS (if applicable):  Keep a dry clean dressing on the anesthesia/puncture wound site if there is drainage.  Once the wound has quit draining you may leave it open to air.  Generally you should leave the bandage intact for twenty four  hours unless there is drainage.  If the epidural site drains for more than 36-48 hours please call the anesthesia department.  QUESTIONS?:  Please feel free to call your physician or the hospital operator if you have any questions, and they will be happy to assist you.      Dilation and Curettage or Vacuum Curettage Dilation and curettage (D&C) and vacuum curettage are minor procedures. A D&C involves stretching (dilation) the cervix and scraping (curettage) the inside lining of the womb (uterus). During a D&C, tissue is gently scraped from the inside lining of the uterus. During a vacuum curettage, the lining and tissue in the uterus are removed with the use of gentle suction.  Curettage may be performed to either diagnose or treat a problem. As a diagnostic procedure, curettage is performed to examine tissues from the uterus. A diagnostic curettage may be performed for the following symptoms:   Irregular bleeding in the uterus.   Bleeding with the development of clots.   Spotting between menstrual periods.   Prolonged menstrual periods.   Bleeding after menopause.   No menstrual period (amenorrhea).   A change in size and shape of the uterus.  As a treatment procedure, curettage may be performed for the following reasons:   Removal of an IUD (intrauterine device).   Removal of retained placenta after giving birth. Retained placenta can  cause an infection or bleeding severe enough to require transfusions.   Abortion.   Miscarriage.   Removal of polyps inside the uterus.   Removal of uncommon types of noncancerous lumps (fibroids).  LET [facility] CARE PROVIDER KNOW ABOUT:   Any allergies you have.   All medicines you are taking, including vitamins, herbs, eye drops, creams, and over-the-counter medicines.   Previous problems you or members of your family have had with the use of anesthetics.   Any blood disorders you have.   Previous surgeries you have  had.   Medical conditions you have. RISKS AND COMPLICATIONS  Generally, this is a safe procedure. However, as with any procedure, complications can occur. Possible complications include:  Excessive bleeding.   Infection of the uterus.   Damage to the cervix.   Development of scar tissue (adhesions) inside the uterus, later causing abnormal amounts of menstrual bleeding.   Complications from the general anesthetic, if a general anesthetic is used.   Putting a hole (perforation) in the uterus. This is rare.  BEFORE THE PROCEDURE   Eat and drink before the procedure only as directed by your health care provider.   Arrange for someone to take you home.  PROCEDURE  This procedure usually takes about 15-30 minutes.  You will be given one of the following:  A medicine that numbs the area in and around the cervix (local anesthetic).   A medicine to make you sleep through the procedure (general anesthetic).  You will lie on your back with your legs in stirrups.   A warm metal or plastic instrument (speculum) will be placed in your vagina to keep it open and to allow the health care provider to see the cervix.  There are two ways in which your cervix can be softened and dilated. These include:   Taking a medicine.   Having thin rods (laminaria) inserted into your cervix.   A curved tool (curette) will be used to scrape cells from the inside lining of the uterus. In some cases, gentle suction is applied with the curette. The curette will then be removed.  AFTER THE PROCEDURE   You will rest in the recovery area until you are stable and are ready to go home.   You may feel sick to your stomach (nauseous) or throw up (vomit) if you were given a general anesthetic.   You may have a sore throat if a tube was placed in your throat during general anesthesia.   You may have light cramping and bleeding. This may last for 2 days to 2 weeks after the procedure.    Your uterus needs to make a new lining after the procedure. This may make your next period late. Document Released: 07/27/2005 Document Revised: 03/29/2013 Document Reviewed: 02/23/2013 [facility] Patient Information 2015 [place], [place]. This information is not intended to replace advice given to you by your health care provider. Make sure you discuss any questions you have with your health care provider.

## 2014-10-26 NOTE — Telephone Encounter (Signed)
Complains of pain and cramps in low pelvis and back, will rx motrin 800 mg, has surgery scheduled for next week

## 2014-10-29 ENCOUNTER — Encounter (HOSPITAL_COMMUNITY): Payer: Self-pay

## 2014-10-29 ENCOUNTER — Encounter (HOSPITAL_COMMUNITY)
Admission: RE | Admit: 2014-10-29 | Discharge: 2014-10-29 | Disposition: A | Discharge status: Home / Self Care | Payer: 59 | Source: Ambulatory Visit | Attending: Obstetrics & Gynecology | Admitting: Obstetrics & Gynecology

## 2014-10-29 DIAGNOSIS — N921 Excessive and frequent menstruation with irregular cycle: Secondary | ICD-10-CM | POA: Diagnosis present

## 2014-10-29 DIAGNOSIS — D259 Leiomyoma of uterus, unspecified: Secondary | ICD-10-CM | POA: Diagnosis not present

## 2014-10-29 DIAGNOSIS — N840 Polyp of corpus uteri: Secondary | ICD-10-CM | POA: Diagnosis not present

## 2014-10-29 DIAGNOSIS — Z9851 Tubal ligation status: Secondary | ICD-10-CM | POA: Diagnosis not present

## 2014-10-29 DIAGNOSIS — N946 Dysmenorrhea, unspecified: Secondary | ICD-10-CM | POA: Diagnosis not present

## 2014-10-29 DIAGNOSIS — D649 Anemia, unspecified: Secondary | ICD-10-CM | POA: Diagnosis not present

## 2014-10-29 LAB — URINALYSIS, ROUTINE W REFLEX MICROSCOPIC
BILIRUBIN URINE: NEGATIVE
Glucose, UA: NEGATIVE mg/dL
HGB URINE DIPSTICK: NEGATIVE
KETONES UR: NEGATIVE mg/dL
Leukocytes, UA: NEGATIVE
Nitrite: NEGATIVE
Protein, ur: NEGATIVE mg/dL
UROBILINOGEN UA: 0.2 mg/dL (ref 0.0–1.0)
pH: 5.5 (ref 5.0–8.0)

## 2014-10-29 LAB — COMPREHENSIVE METABOLIC PANEL
ALK PHOS: 69 U/L (ref 39–117)
ALT: 17 U/L (ref 0–35)
AST: 20 U/L (ref 0–37)
Albumin: 4.2 g/dL (ref 3.5–5.2)
Anion gap: 8 (ref 5–15)
BILIRUBIN TOTAL: 1 mg/dL (ref 0.3–1.2)
BUN: 15 mg/dL (ref 6–23)
CO2: 23 mmol/L (ref 19–32)
Calcium: 8.9 mg/dL (ref 8.4–10.5)
Chloride: 108 mmol/L (ref 96–112)
Creatinine, Ser: 0.72 mg/dL (ref 0.50–1.10)
GFR calc Af Amer: >90 mL/min (ref >90)
Glucose, Bld: 83 mg/dL (ref 70–99)
Potassium: 4 mmol/L (ref 3.5–5.1)
SODIUM: 139 mmol/L (ref 135–145)
Total Protein: 7.7 g/dL (ref 6.0–8.3)

## 2014-10-29 LAB — HCG, SERUM, QUALITATIVE: Preg, Serum: NEGATIVE

## 2014-10-29 NOTE — Pre-Procedure Instructions (Signed)
Patient given information to sign up for my chart at home. 

## 2014-10-31 ENCOUNTER — Ambulatory Visit (HOSPITAL_COMMUNITY)
Admission: RE | Admit: 2014-10-31 | Discharge: 2014-10-31 | Disposition: A | Discharge status: Home / Self Care | Payer: 59 | Source: Ambulatory Visit | Attending: Obstetrics & Gynecology | Admitting: Obstetrics & Gynecology

## 2014-10-31 ENCOUNTER — Encounter (HOSPITAL_COMMUNITY)
Admission: RE | Disposition: A | Discharge status: Home / Self Care | Payer: Self-pay | Source: Ambulatory Visit | Attending: Obstetrics & Gynecology

## 2014-10-31 ENCOUNTER — Ambulatory Visit (HOSPITAL_COMMUNITY): Payer: 59 | Admitting: Anesthesiology

## 2014-10-31 ENCOUNTER — Encounter (HOSPITAL_COMMUNITY): Payer: Self-pay | Admitting: *Deleted

## 2014-10-31 DIAGNOSIS — N840 Polyp of corpus uteri: Secondary | ICD-10-CM | POA: Insufficient documentation

## 2014-10-31 DIAGNOSIS — N921 Excessive and frequent menstruation with irregular cycle: Secondary | ICD-10-CM

## 2014-10-31 DIAGNOSIS — D259 Leiomyoma of uterus, unspecified: Secondary | ICD-10-CM | POA: Insufficient documentation

## 2014-10-31 DIAGNOSIS — Z9851 Tubal ligation status: Secondary | ICD-10-CM | POA: Insufficient documentation

## 2014-10-31 DIAGNOSIS — D649 Anemia, unspecified: Secondary | ICD-10-CM | POA: Insufficient documentation

## 2014-10-31 DIAGNOSIS — N946 Dysmenorrhea, unspecified: Secondary | ICD-10-CM | POA: Insufficient documentation

## 2014-10-31 HISTORY — PX: DILITATION & CURRETTAGE/HYSTROSCOPY WITH NOVASURE ABLATION: SHX5568

## 2014-10-31 LAB — CBC
HCT: 37.8 % (ref 36.0–46.0)
HEMOGLOBIN: 12.1 g/dL (ref 12.0–15.0)
MCH: 25.8 pg — ABNORMAL LOW (ref 26.0–34.0)
MCHC: 32 g/dL (ref 30.0–36.0)
MCV: 80.6 fL (ref 78.0–100.0)
Platelets: 264 10*3/uL (ref 150–400)
RBC: 4.69 MIL/uL (ref 3.87–5.11)
RDW: 15 % (ref 11.5–15.5)
WBC: 5.8 10*3/uL (ref 4.0–10.5)

## 2014-10-31 SURGERY — DILATATION & CURETTAGE/HYSTEROSCOPY WITH NOVASURE ABLATION
Anesthesia: General | Site: Vagina

## 2014-10-31 MED ORDER — LIDOCAINE HCL (PF) 1 % IJ SOLN
INTRAMUSCULAR | Status: AC
  Filled 2014-10-31: qty 10

## 2014-10-31 MED ORDER — PROPOFOL 10 MG/ML IV BOLUS
INTRAVENOUS | Status: AC
  Filled 2014-10-31: qty 20

## 2014-10-31 MED ORDER — ONDANSETRON HCL 4 MG/2ML IJ SOLN: 4.0000 mg | Freq: Once | INTRAMUSCULAR | Status: DC | PRN

## 2014-10-31 MED ORDER — KETOROLAC TROMETHAMINE 30 MG/ML IJ SOLN
30.0000 mg | Freq: Once | INTRAMUSCULAR | Status: AC
  Administered 2014-10-31: 30 mg via INTRAVENOUS
  Filled 2014-10-31: qty 1

## 2014-10-31 MED ORDER — SODIUM CHLORIDE 0.9 % IR SOLN
Status: DC | PRN
  Administered 2014-10-31: 1000 mL

## 2014-10-31 MED ORDER — 0.9 % SODIUM CHLORIDE (POUR BTL) OPTIME
TOPICAL | Status: DC | PRN
  Administered 2014-10-31: 500 mL

## 2014-10-31 MED ORDER — FENTANYL CITRATE 0.05 MG/ML IJ SOLN
INTRAMUSCULAR | Status: DC | PRN
  Administered 2014-10-31: 25 ug via INTRAVENOUS
  Administered 2014-10-31: 50 ug via INTRAVENOUS
  Administered 2014-10-31: 25 ug via INTRAVENOUS

## 2014-10-31 MED ORDER — ONDANSETRON HCL 8 MG PO TABS: 8.0000 mg | ORAL_TABLET | Freq: Three times a day (TID) | ORAL | Status: AC | PRN

## 2014-10-31 MED ORDER — MIDAZOLAM HCL 2 MG/2ML IJ SOLN
1.0000 mg | INTRAMUSCULAR | Status: DC | PRN
  Administered 2014-10-31: 2 mg via INTRAVENOUS
  Filled 2014-10-31: qty 2

## 2014-10-31 MED ORDER — FENTANYL CITRATE 0.05 MG/ML IJ SOLN
25.0000 ug | INTRAMUSCULAR | Status: AC
  Administered 2014-10-31 (×2): 25 ug via INTRAVENOUS
  Filled 2014-10-31: qty 2

## 2014-10-31 MED ORDER — FENTANYL CITRATE 0.05 MG/ML IJ SOLN: 25.0000 ug | INTRAMUSCULAR | Status: DC | PRN

## 2014-10-31 MED ORDER — CEFAZOLIN SODIUM-DEXTROSE 2-3 GM-% IV SOLR
2.0000 g | INTRAVENOUS | Status: AC
  Administered 2014-10-31: 2 g via INTRAVENOUS
  Filled 2014-10-31: qty 50

## 2014-10-31 MED ORDER — SUCCINYLCHOLINE CHLORIDE 20 MG/ML IJ SOLN
INTRAMUSCULAR | Status: AC
  Filled 2014-10-31: qty 1

## 2014-10-31 MED ORDER — FENTANYL CITRATE 0.05 MG/ML IJ SOLN
INTRAMUSCULAR | Status: AC
  Filled 2014-10-31: qty 5

## 2014-10-31 MED ORDER — LACTATED RINGERS IV SOLN
INTRAVENOUS | Status: DC | PRN
  Administered 2014-10-31 (×2): via INTRAVENOUS

## 2014-10-31 MED ORDER — ONDANSETRON HCL 4 MG/2ML IJ SOLN
4.0000 mg | Freq: Once | INTRAMUSCULAR | Status: AC
  Administered 2014-10-31: 4 mg via INTRAVENOUS
  Filled 2014-10-31: qty 2

## 2014-10-31 MED ORDER — HYDROCODONE-ACETAMINOPHEN 5-325 MG PO TABS: 1.0000 | ORAL_TABLET | Freq: Four times a day (QID) | ORAL | Status: AC | PRN

## 2014-10-31 MED ORDER — KETOROLAC TROMETHAMINE 10 MG PO TABS: 10.0000 mg | ORAL_TABLET | Freq: Three times a day (TID) | ORAL | Status: AC | PRN

## 2014-10-31 MED ORDER — PROPOFOL 10 MG/ML IV BOLUS
INTRAVENOUS | Status: DC | PRN
  Administered 2014-10-31: 140 mg via INTRAVENOUS

## 2014-10-31 MED ORDER — LIDOCAINE HCL (CARDIAC) 20 MG/ML IV SOLN
INTRAVENOUS | Status: DC | PRN
  Administered 2014-10-31: 50 mg via INTRAVENOUS

## 2014-10-31 MED ORDER — ARTIFICIAL TEARS OP OINT
TOPICAL_OINTMENT | OPHTHALMIC | Status: AC
  Filled 2014-10-31: qty 3.5

## 2014-10-31 MED ORDER — LACTATED RINGERS IV SOLN
INTRAVENOUS | Status: DC
  Administered 2014-10-31: 1000 mL via INTRAVENOUS

## 2014-10-31 SURGICAL SUPPLY — 26 items
ABLATOR ENDOMETRIAL BIPOLAR (ABLATOR) ×3 IMPLANT
BAG HAMPER (MISCELLANEOUS) ×3 IMPLANT
CLOTH BEACON ORANGE TIMEOUT ST (SAFETY) ×3 IMPLANT
COVER LIGHT HANDLE STERIS (MISCELLANEOUS) ×6 IMPLANT
ELECT BLADE 6 FLAT ULTRCLN (ELECTRODE) ×3 IMPLANT
FORMALIN 10 PREFIL 120ML (MISCELLANEOUS) ×3 IMPLANT
GAUZE PETROLATUM 1 X8 (GAUZE/BANDAGES/DRESSINGS) ×3 IMPLANT
GLOVE BIOGEL PI IND STRL 8 (GLOVE) ×1 IMPLANT
GLOVE BIOGEL PI INDICATOR 8 (GLOVE) ×2
GLOVE ECLIPSE 8.0 STRL XLNG CF (GLOVE) ×3 IMPLANT
GLOVE INDICATOR 7.0 STRL GRN (GLOVE) ×3 IMPLANT
GLOVE SS BIOGEL STRL SZ 6.5 (GLOVE) ×1 IMPLANT
GLOVE SUPERSENSE BIOGEL SZ 6.5 (GLOVE) ×2
GOWN STRL REUS W/TWL LRG LVL3 (GOWN DISPOSABLE) ×3 IMPLANT
GOWN STRL REUS W/TWL XL LVL3 (GOWN DISPOSABLE) ×3 IMPLANT
INST SET HYSTEROSCOPY (KITS) ×3 IMPLANT
IV NS 1000ML (IV SOLUTION) ×2
IV NS 1000ML BAXH (IV SOLUTION) ×1 IMPLANT
KIT ROOM TURNOVER AP CYSTO (KITS) ×3 IMPLANT
MANIFOLD NEPTUNE II (INSTRUMENTS) ×3 IMPLANT
NS IRRIG 1000ML POUR BTL (IV SOLUTION) ×3 IMPLANT
PACK PERI GYN (CUSTOM PROCEDURE TRAY) ×3 IMPLANT
PAD ARMBOARD 7.5X6 YLW CONV (MISCELLANEOUS) ×3 IMPLANT
PAD TELFA 3X4 1S STER (GAUZE/BANDAGES/DRESSINGS) ×3 IMPLANT
SET BASIN LINEN APH (SET/KITS/TRAYS/PACK) ×3 IMPLANT
SET IRRIG Y TYPE TUR BLADDER L (SET/KITS/TRAYS/PACK) ×3 IMPLANT

## 2014-10-31 NOTE — Anesthesia Postprocedure Evaluation (Signed)
  Anesthesia Post-op Note Late entry  Patient: Amanda Poole  Procedure(s) Performed: Procedure(s): DILATATION & CURETTAGE/HYSTEROSCOPY WITH NOVASURE ABLATION uterine cavity length-5.5cm uterine cavity width-3.1cm Power 94 watts Time 59min 10sec   (N/A)  Patient Location: PACU  Anesthesia Type:General  Level of Consciousness: awake, alert , oriented and patient cooperative  Airway and Oxygen Therapy: Patient Spontanous Breathing and Patient connected to face mask oxygen  Post-op Pain: none  Post-op Assessment: Post-op Vital signs reviewed, Patient's Cardiovascular Status Stable, Respiratory Function Stable, Patent Airway, No signs of Nausea or vomiting and Pain level controlled  Post-op Vital Signs: Reviewed and stable  Last Vitals:  Filed Vitals:   10/31/14 1202  BP: 116/68  Pulse: 66  Temp: 36.5 C  Resp: 20    Complications: No apparent anesthesia complications

## 2014-10-31 NOTE — Anesthesia Preprocedure Evaluation (Addendum)
Anesthesia Evaluation  Patient identified by MRN, date of birth, ID band Patient awake    Reviewed: Allergy & Precautions, NPO status , Patient's Chart, lab work & pertinent test results  Airway Mallampati: II  TM Distance: >3 FB     Dental  (+) Teeth Intact   Pulmonary neg pulmonary ROS,  breath sounds clear to auscultation        Cardiovascular negative cardio ROS  Rhythm:Regular Rate:Normal     Neuro/Psych    GI/Hepatic negative GI ROS,   Endo/Other    Renal/GU      Musculoskeletal   Abdominal   Peds  Hematology   Anesthesia Other Findings   Reproductive/Obstetrics                             Anesthesia Physical Anesthesia Plan  ASA: I  Anesthesia Plan: General   Post-op Pain Management:    Induction: Intravenous  Airway Management Planned: LMA  Additional Equipment:   Intra-op Plan:   Post-operative Plan: Extubation in OR  Informed Consent: I have reviewed the patients History and Physical, chart, labs and discussed the procedure including the risks, benefits and alternatives for the proposed anesthesia with the patient or authorized representative who has indicated his/her understanding and acceptance.     Plan Discussed with:   Anesthesia Plan Comments:         Anesthesia Quick Evaluation

## 2014-10-31 NOTE — Discharge Instructions (Signed)
Endometrial Ablation Endometrial ablation removes the lining of the uterus (endometrium). It is usually a same-day, outpatient treatment. Ablation helps avoid major surgery, such as surgery to remove the cervix and uterus (hysterectomy). After endometrial ablation, you will have little or no menstrual bleeding and may not be able to have children. However, if you are premenopausal, you will need to use a reliable method of birth control following the procedure because of the small chance that pregnancy can occur. There are different reasons to have this procedure, which include:  Heavy periods.  Bleeding that is causing anemia.  Irregular bleeding.  Bleeding fibroids on the lining inside the uterus if they are smaller than 3 centimeters. This procedure should not be done if:  You want children in the future.  You have severe cramps with your menstrual period.  You have precancerous or cancerous cells in your uterus.  You were recently pregnant.  You have gone through menopause.  You have had major surgery on the uterus, such as a cesarean delivery. LET YOUR HEALTH CARE PROVIDER KNOW ABOUT:  Any allergies you have.  All medicines you are taking, including vitamins, herbs, eye drops, creams, and over-the-counter medicines.  Previous problems you or members of your family have had with the use of anesthetics.  Any blood disorders you have.  Previous surgeries you have had.  Medical conditions you have. RISKS AND COMPLICATIONS  Generally, this is a safe procedure. However, as with any procedure, complications can occur. Possible complications include:  Perforation of the uterus.  Bleeding.  Infection of the uterus, bladder, or vagina.  Injury to surrounding organs.  An air bubble to the lung (air embolus).  Pregnancy following the procedure.  Failure of the procedure to help the problem, requiring hysterectomy.  Decreased ability to diagnose cancer in the lining of  the uterus. BEFORE THE PROCEDURE  The lining of the uterus must be tested to make sure there is no pre-cancerous or cancer cells present.  An ultrasound may be performed to look at the size of the uterus and to check for abnormalities.  Medicines may be given to thin the lining of the uterus. PROCEDURE  During the procedure, your health care provider will use a tool called a resectoscope to help see inside your uterus. There are different ways to remove the lining of your uterus.   Radiofrequency - This method uses a radiofrequency-alternating electric current to remove the lining of the uterus.  Cryotherapy - This method uses extreme cold to freeze the lining of the uterus.  Heated-Free Liquid - This method uses heated salt (saline) solution to remove the lining of the uterus.  Microwave - This method uses high-energy microwaves to heat up the lining of the uterus to remove it.  Thermal balloon - This method involves inserting a catheter with a balloon tip into the uterus. The balloon tip is filled with heated fluid to remove the lining of the uterus. AFTER THE PROCEDURE  After your procedure, do not have sexual intercourse or insert anything into your vagina until permitted by your health care provider. After the procedure, you may experience:  Cramps.  Vaginal discharge.  Frequent urination. Document Released: 06/05/2004 Document Revised: 03/29/2013 Document Reviewed: 12/28/2012 [employer] Patient Information 2015 [employer]. This information is not intended to replace advice given to you by your health care provider. Make sure you discuss any questions you have with your health care provider.  

## 2014-10-31 NOTE — H&P (Signed)
Amanda Poole (MR# [id number])      Progress Notes Info    Author Note Status Last Update User Last Update Date/Time   Amanda Buff, MD Signed Amanda Buff, MD 10/16/2014 3:57 PM    Progress Notes    Expand All Collapse All   Patient ID: Amanda Poole, female DOB: 1966-02-26, 49 y.o. MRN: [id number] Decision for surgery visit: See H&P below  Preoperative History and Physical  Amanda Poole is a 49 y.o. [id number] with Patient's last menstrual period was 10/05/2014. admitted for a hysteroscopy uterine curettage endometrial ablation using NovaSure.  Has been having heavy menstrual bleeding clotting with anemia and was controlled ok on megace, sonogram reveals small endometrial distorting fibroids but nothing major. Now bleeding despite the meagce now for 10 days heavy crampy clots  PMH:  Past Medical History  Diagnosis Date  . Menorrhagia with regular cycle 07/12/2014  . Dysmenorrhea 07/12/2014  . Fibroids 07/12/2014    PSH:  Past Surgical History  Procedure Laterality Date  . Tubal ligation      POb/GynH:  OB History    Gravida Para Term Preterm AB TAB SAB Ectopic Multiple Living   3 2   1 1    2       SH:  History  Substance Use Topics  . Smoking status: Never Smoker   . Smokeless tobacco: Never Used  . Alcohol Use: Yes     Comment: occ.    FH:  Family History  Problem Relation Age of Onset  . Hypertension Mother   . Cancer Father     esophageal  . Diabetes Sister   . Cancer Paternal Aunt   . Cancer Paternal Uncle   . Heart disease Maternal Grandmother   . Heart attack Maternal Grandmother   . Asthma Son      Allergies: No Known Allergies  Medications:  Current outpatient prescriptions:  . megestrol (MEGACE) 40 MG tablet, TAKE 3 DAILY FOR 5 DAYS THEN 2 DAILY FOR 5 DAYS THEN 1 TABLET DAILY, Disp: 45 tablet, Rfl: 1  Review  of Systems:   Review of Systems  Constitutional: Negative for fever, chills, weight loss, malaise/fatigue and diaphoresis.  HENT: Negative for hearing loss, ear pain, nosebleeds, congestion, sore throat, neck pain, tinnitus and ear discharge.  Eyes: Negative for blurred vision, double vision, photophobia, pain, discharge and redness.  Respiratory: Negative for cough, hemoptysis, sputum production, shortness of breath, wheezing and stridor.  Cardiovascular: Negative for chest pain, palpitations, orthopnea, claudication, leg swelling and PND.  Gastrointestinal: negativefor abdominal pain. Negative for heartburn, nausea, vomiting, diarrhea, constipation, blood in stool and melena.  Genitourinary: Negative for dysuria, urgency, frequency, hematuria and flank pain.  Musculoskeletal: Negative for myalgias, back pain, joint pain and falls.  Skin: Negative for itching and rash.  Neurological: Negative for dizziness, tingling, tremors, sensory change, speech change, focal weakness, seizures, loss of consciousness, weakness and headaches.  Endo/Heme/Allergies: Negative for environmental allergies and polydipsia. Does not bruise/bleed easily.  Psychiatric/Behavioral: Negative for depression, suicidal ideas, hallucinations, memory loss and substance abuse. The patient is not nervous/anxious and does not have insomnia.     PHYSICAL EXAM:  Blood pressure 118/80, pulse 84, weight 169 lb (76.658 kg), last menstrual period 10/05/2014.   Vitals reviewed. Constitutional: She is oriented to person, place, and time. She appears well-developed and well-nourished.  HENT:  Head: Normocephalic and atraumatic.  Right Ear: External ear normal.  Left Ear: External ear normal.  Nose: Nose normal.  Mouth/Throat: Oropharynx  is clear and moist.  Eyes: Conjunctivae and EOM are normal. Pupils are equal, round, and reactive to light. Right eye exhibits no discharge. Left eye exhibits no discharge. No scleral  icterus.  Neck: Normal range of motion. Neck supple. No tracheal deviation present. No thyromegaly present.  Cardiovascular: Normal rate, regular rhythm, normal heart sounds and intact distal pulses. Exam reveals no gallop and no friction rub.  No murmur heard. Respiratory: Effort normal and breath sounds normal. No respiratory distress. She has no wheezes. She has no rales. She exhibits no tenderness.  GI: Soft. Bowel sounds are normal. She exhibits no distension and no mass. There is tenderness. There is no rebound and no guarding.  Genitourinary:   Vulva is normal without lesions Vagina is pink moist without discharge Cervix normal in appearance and pap is normal Uterus is normal size see sonogram Adnexa is negative with normal sized ovaries by sonogram  Musculoskeletal: Normal range of motion. She exhibits no edema and no tenderness.  Neurological: She is alert and oriented to person, place, and time. She has normal reflexes. She displays normal reflexes. No cranial nerve deficit. She exhibits normal muscle tone. Coordination normal.  Skin: Skin is warm and dry. No rash noted. No erythema. No pallor.  Psychiatric: She has a normal mood and affect. Her behavior is normal. Judgment and thought content normal.    Labs: No results found for this or any previous visit (from the past 336 hour(s)).  EKG: No orders found for this or any previous visit.  Imaging Studies: GYNECOLOGIC SONOGRAM   Amanda Poole is a 49 y.o. [id number] LMP 07/10/2014 for a pelvic sonogram for DUB with clots. Pt is s/p BTL.  Uterus Anteverted 10.7 x 8.8 x 5.3 cm, with multiple fibroids noted throughout myometrium largest= 60mm  Endometrium 8.3 mm, distorted by fibroids although no obvious mass noted within the cavity difficult to evaluate due to fibroids  Right ovary 2.6 x 1.5 x 1.4 cm,   Left ovary 2.1 x 1.2 x 0.9 cm,   No free fluid or adnexal  masses noted within the pelvis  Technician Comments:  Anteverted uterus noted with multiple fibroids noted throughout the myometrium, Endometrium distorted by fibroids, bilateral adnexa/ovaries appears WNL   Amanda Poole 07/12/2014 3:28 PM  Clinical Impression and recommendations:  I have reviewed the sonogram results above, combined with the patient's current clinical course, below are my impressions and any appropriate recommendations for management based on the sonographic findings.  Enlarged fibroid uterus, submucosal Normal ovaries   EURE,LUTHER H 07/13/2014 5:33 PM     Assessment: Patient Active Problem List   Diagnosis Date Noted  . Menorrhagia with regular cycle 07/12/2014  . Dysmenorrhea 07/12/2014  . Fibroids 07/12/2014  anemia  Plan: Hysteroscopy uterine curettage endometrial ablation 10/31/2014       EURE,LUTHER H 10/31/2014 9:24 AM

## 2014-10-31 NOTE — Op Note (Signed)
Preoperative diagnosis: Menometrorrhagia                                        Dysmenorrhea                                        Small fibroids  Postoperative diagnoses: Same as above   Procedure: Hysteroscopy, uterine curettage, endometrial ablation using Novasure  Surgeon: Florian Buff   Anesthesia: Laryngeal mask airway  Findings: The endometrium was significant for small endometrial polyp. There were no fibroid or other abnormalities.  Description of operation: The patient was taken to the operating room and placed in the supine position. She underwent general anesthesia using the laryngeal mask airway. She was placed in the dorsal lithotomy position and prepped and draped in the usual sterile fashion. A Graves speculum was placed and the anterior cervical lip was grasped with a single-tooth tenaculum. The cervix was dilated serially to allow passage of the hysteroscope. Diagnostic hysteroscopy was performed and was found to be normal. A vigorous uterine curettage was then performed and all tissue sent to pathology for evaluation.  I then proceeded to perform the Novasure endometrial ablation.  The cervical length was 3.5. The uterus sounded to  9 cm yielding a net length of 5.5 cm.  The endometrial cavity was 3.1 cm wide. The power was 94 watts.  The total time of therapy was 1 min 10 seconds. The array was evaluated after the procedure and tissue was adherent on all the dimensions of the surface, confirming fundal treatment as well.    All of the equipment worked well throughout the procedure.  The patient was awakened from anesthesia and taken to the recovery room in good stable condition all counts were correct. She received 2 g of Ancef and 30 mg of Toradol preoperatively. She will be discharged from the recovery room and followed up in the office in 1- 2 weeks.  Fallen Crisostomo H 10/31/2014 10:59 AM

## 2014-10-31 NOTE — Transfer of Care (Signed)
Immediate Anesthesia Transfer of Care Note  Patient: Amanda Poole  Procedure(s) Performed: Procedure(s): DILATATION & CURETTAGE/HYSTEROSCOPY WITH NOVASURE ABLATION uterine cavity length-5.5cm uterine cavity width-3.1cm Power 94 watts Time 50min 10sec   (N/A)  Patient Location: PACU  Anesthesia Type:General  Level of Consciousness: awake, alert , oriented and patient cooperative  Airway & Oxygen Therapy: Patient Spontanous Breathing and Patient connected to face mask oxygen  Post-op Assessment: Report given to RN and Post -op Vital signs reviewed and stable  Post vital signs: Reviewed and stable  Last Vitals:  Filed Vitals:   10/31/14 0950  BP: 111/60  Pulse:   Temp:   Resp: 33    Complications: No apparent anesthesia complications

## 2014-10-31 NOTE — Anesthesia Procedure Notes (Signed)
Procedure Name: LMA Insertion Date/Time: 10/31/2014 10:01 AM Performed by: Andree Elk, Latrecia Capito A Oxygen Delivery Method: Circle system utilized Preoxygenation: Pre-oxygenation with 100% oxygen Intubation Type: IV induction Ventilation: Mask ventilation without difficulty LMA: LMA inserted LMA Size: 4.0 Number of attempts: 1 Placement Confirmation: positive ETCO2 and breath sounds checked- equal and bilateral Tube secured with: Tape Dental Injury: Teeth and Oropharynx as per pre-operative assessment

## 2014-11-01 ENCOUNTER — Encounter (HOSPITAL_COMMUNITY): Payer: Self-pay | Admitting: Obstetrics & Gynecology

## 2014-11-07 ENCOUNTER — Ambulatory Visit (INDEPENDENT_AMBULATORY_CARE_PROVIDER_SITE_OTHER): Payer: 59 | Admitting: Obstetrics & Gynecology

## 2014-11-07 ENCOUNTER — Encounter: Payer: Self-pay | Admitting: Obstetrics & Gynecology

## 2014-11-07 VITALS — BP 120/80 | HR 72 | Wt 159.0 lb

## 2014-11-07 DIAGNOSIS — Z9889 Other specified postprocedural states: Secondary | ICD-10-CM

## 2014-11-07 NOTE — Progress Notes (Signed)
Patient ID: Amanda Poole, female   DOB: Jan 01, 1966, 49 y.o.   MRN: [id number]  HPI: Patient returns for routine postoperative follow-up having undergone hysteroscopy uterine curettage endometrial ablation using NovaSure on 10/31/2014.  The patient's immediate postoperative recovery has been unremarkable. Since hospital discharge the patient reports no problems.   Current Outpatient Prescriptions: Multiple Vitamins-Calcium (ONE-A-DAY WOMENS PO), Take 1 tablet by mouth daily., Disp: , Rfl:  HYDROcodone-acetaminophen (NORCO/VICODIN) 5-325 MG per tablet, Take 1 tablet by mouth every 6 (six) hours as needed. (Patient not taking: Reported on 11/07/2014), Disp: 30 tablet, Rfl: 0 ibuprofen (ADVIL,MOTRIN) 800 MG tablet, Take 1 tablet (800 mg total) by mouth every 8 (eight) hours as needed. (Patient not taking: Reported on 11/07/2014), Disp: 60 tablet, Rfl: 1 ketorolac (TORADOL) 10 MG tablet, Take 1 tablet (10 mg total) by mouth every 8 (eight) hours as needed. (Patient not taking: Reported on 11/07/2014), Disp: 15 tablet, Rfl: 0 ondansetron (ZOFRAN) 8 MG tablet, Take 1 tablet (8 mg total) by mouth every 8 (eight) hours as needed for nausea. (Patient not taking: Reported on 11/07/2014), Disp: 12 tablet, Rfl: 0  No current facility-administered medications for this visit.    Blood pressure 120/80, pulse 72, weight 159 lb (72.122 kg), last menstrual period 10/05/2014.  Physical Exam: General Appearance:  well developed, well nourished and in no acute distress  Vulva:  normal appearing vulva with no masses, tenderness or lesions Vagina:  normal mucosa, watery discharge consistent with ablation  Cervix:  no lesions Uterus:  normal size, contour, position, consistency, mobility, non-tender and anteverted Adnexa: ovaries:present,  normal adnexa in size, nontender and no masses    Diagnostic Tests:   Pathology: Benign, normal  Impression: S/P endometrial ablation  Plan: Follow up: 1  years or  prn  Florian Buff, MD

## 2015-04-10 ENCOUNTER — Other Ambulatory Visit: Payer: Self-pay | Admitting: Adult Health

## 2015-05-10 ENCOUNTER — Other Ambulatory Visit: Payer: Self-pay | Admitting: Adult Health

## 2024-02-14 ENCOUNTER — Other Ambulatory Visit: Payer: Self-pay | Admitting: Obstetrics and Gynecology

## 2024-02-14 DIAGNOSIS — Z1231 Encounter for screening mammogram for malignant neoplasm of breast: Secondary | ICD-10-CM

## 2024-04-19 ENCOUNTER — Ambulatory Visit: Payer: Self-pay

## 2024-04-26 ENCOUNTER — Ambulatory Visit
Admission: RE | Admit: 2024-04-26 | Discharge: 2024-04-26 | Disposition: A | Discharge status: Home / Self Care | Source: Ambulatory Visit | Attending: Obstetrics and Gynecology | Admitting: Obstetrics and Gynecology

## 2024-04-26 DIAGNOSIS — Z1231 Encounter for screening mammogram for malignant neoplasm of breast: Secondary | ICD-10-CM

## 2024-05-03 ENCOUNTER — Other Ambulatory Visit: Payer: Self-pay | Admitting: Obstetrics and Gynecology

## 2024-05-03 DIAGNOSIS — R928 Other abnormal and inconclusive findings on diagnostic imaging of breast: Secondary | ICD-10-CM

## 2024-05-17 ENCOUNTER — Ambulatory Visit
Admission: RE | Admit: 2024-05-17 | Discharge: 2024-05-17 | Disposition: A | Discharge status: Home / Self Care | Source: Ambulatory Visit | Attending: Obstetrics and Gynecology | Admitting: Obstetrics and Gynecology

## 2024-05-17 ENCOUNTER — Ambulatory Visit
Admission: RE | Admit: 2024-05-17 | Discharge: 2024-05-17 | Disposition: A | Source: Ambulatory Visit | Attending: Obstetrics and Gynecology | Admitting: Obstetrics and Gynecology

## 2024-05-17 DIAGNOSIS — R928 Other abnormal and inconclusive findings on diagnostic imaging of breast: Secondary | ICD-10-CM

## 2024-05-18 ENCOUNTER — Other Ambulatory Visit: Payer: Self-pay | Admitting: Obstetrics and Gynecology

## 2024-05-18 DIAGNOSIS — N6320 Unspecified lump in the left breast, unspecified quadrant: Secondary | ICD-10-CM

## 2024-11-16 ENCOUNTER — Other Ambulatory Visit
